# Patient Record
Sex: Male | Born: 2000
Health system: Southern US, Community
[De-identification: ages and names within clinical notes are randomized; demographics above are authoritative.]

---
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
## 2020-07-17 DIAGNOSIS — Z20822 Contact with and (suspected) exposure to covid-19: Secondary | ICD-10-CM | POA: Diagnosis not present

## 2020-10-22 DIAGNOSIS — R21 Rash and other nonspecific skin eruption: Secondary | ICD-10-CM | POA: Diagnosis not present

## 2020-10-22 DIAGNOSIS — T7840XA Allergy, unspecified, initial encounter: Secondary | ICD-10-CM | POA: Diagnosis not present

## 2021-04-16 DIAGNOSIS — M62452 Contracture of muscle, left thigh: Secondary | ICD-10-CM | POA: Diagnosis not present

## 2021-04-19 DIAGNOSIS — M62452 Contracture of muscle, left thigh: Secondary | ICD-10-CM | POA: Diagnosis not present

## 2021-04-20 DIAGNOSIS — M62452 Contracture of muscle, left thigh: Secondary | ICD-10-CM | POA: Diagnosis not present

## 2021-04-22 DIAGNOSIS — M62452 Contracture of muscle, left thigh: Secondary | ICD-10-CM | POA: Diagnosis not present

## 2021-04-23 DIAGNOSIS — M62452 Contracture of muscle, left thigh: Secondary | ICD-10-CM | POA: Diagnosis not present

## 2021-04-26 DIAGNOSIS — M62452 Contracture of muscle, left thigh: Secondary | ICD-10-CM | POA: Diagnosis not present

## 2021-04-29 DIAGNOSIS — M62452 Contracture of muscle, left thigh: Secondary | ICD-10-CM | POA: Diagnosis not present

## 2021-05-03 DIAGNOSIS — M62452 Contracture of muscle, left thigh: Secondary | ICD-10-CM | POA: Diagnosis not present

## 2021-05-11 DIAGNOSIS — M62452 Contracture of muscle, left thigh: Secondary | ICD-10-CM | POA: Diagnosis not present

## 2021-06-16 DIAGNOSIS — M25552 Pain in left hip: Secondary | ICD-10-CM | POA: Diagnosis not present

## 2021-06-28 DIAGNOSIS — G44309 Post-traumatic headache, unspecified, not intractable: Secondary | ICD-10-CM | POA: Diagnosis not present

## 2021-06-29 DIAGNOSIS — M62452 Contracture of muscle, left thigh: Secondary | ICD-10-CM | POA: Diagnosis not present

## 2021-06-30 DIAGNOSIS — M62452 Contracture of muscle, left thigh: Secondary | ICD-10-CM | POA: Diagnosis not present

## 2021-07-01 DIAGNOSIS — M62452 Contracture of muscle, left thigh: Secondary | ICD-10-CM | POA: Diagnosis not present

## 2021-07-02 DIAGNOSIS — M62452 Contracture of muscle, left thigh: Secondary | ICD-10-CM | POA: Diagnosis not present

## 2021-07-06 DIAGNOSIS — M62452 Contracture of muscle, left thigh: Secondary | ICD-10-CM | POA: Diagnosis not present

## 2021-07-08 DIAGNOSIS — M62452 Contracture of muscle, left thigh: Secondary | ICD-10-CM | POA: Diagnosis not present

## 2021-07-09 DIAGNOSIS — M62452 Contracture of muscle, left thigh: Secondary | ICD-10-CM | POA: Diagnosis not present

## 2021-07-17 IMAGING — DX DG TOE GREAT 2+V*R*
3 series · 3 of 3 positions shown · non-contrast
Comparison: Prior radiograph from earlier the same day.

CLINICAL DATA: Follow-up examination status post reduction.

EXAM:
RIGHT GREAT TOE

[toe ap]
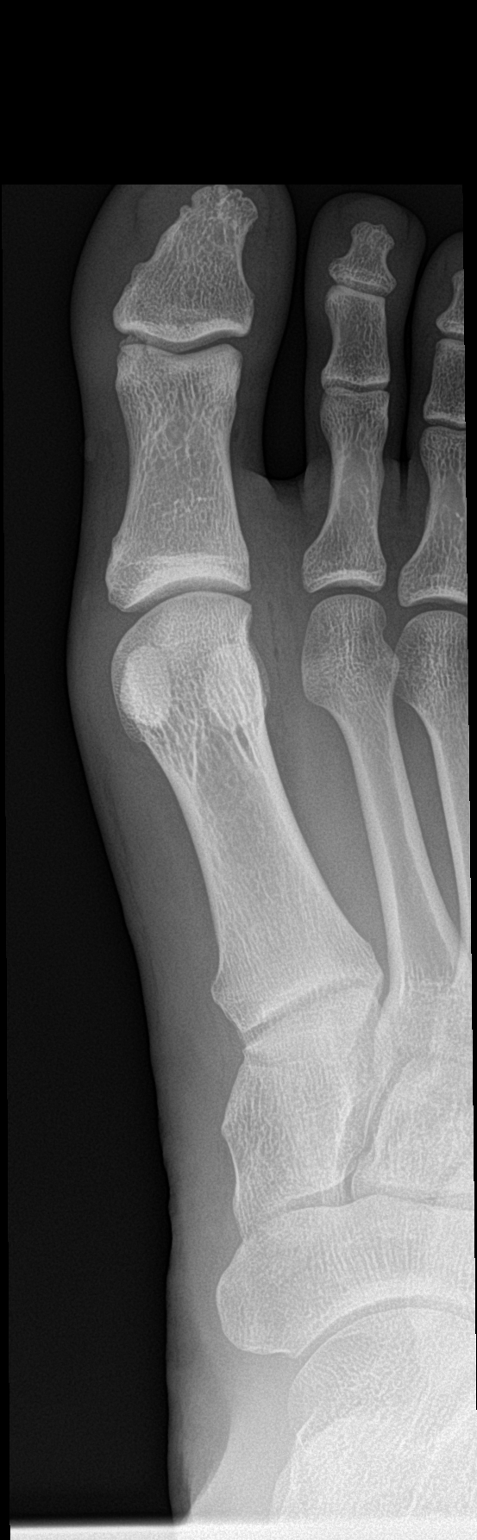

[toe obl]
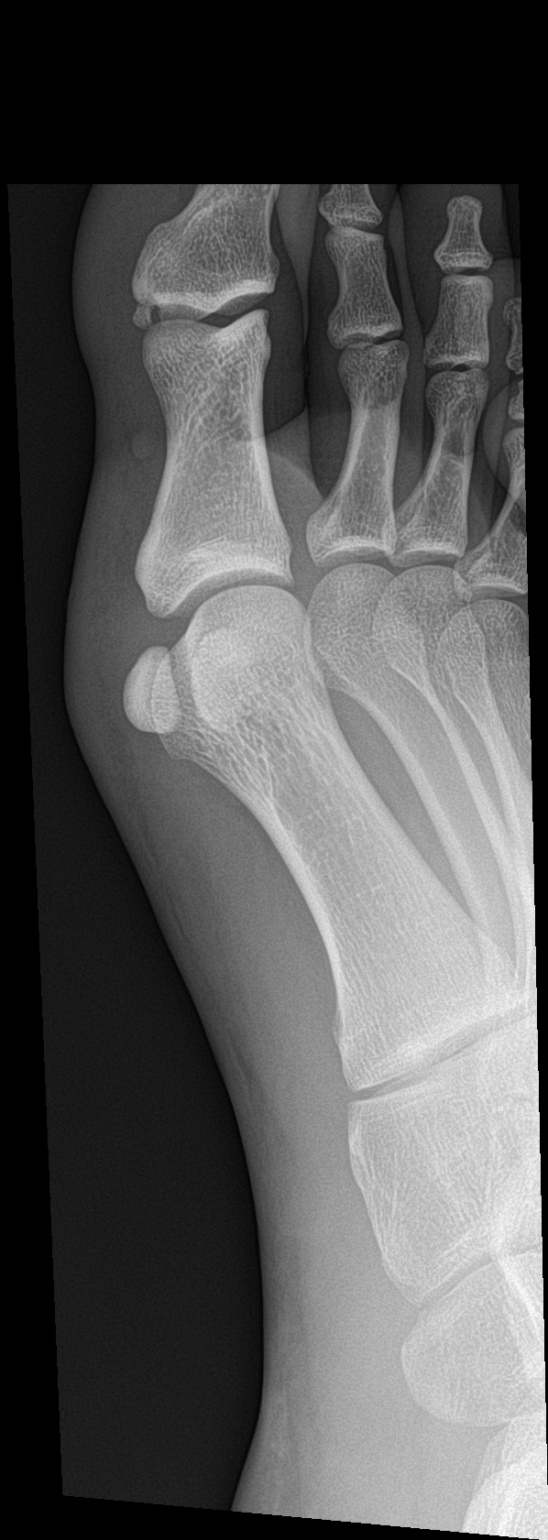

[toe lat]
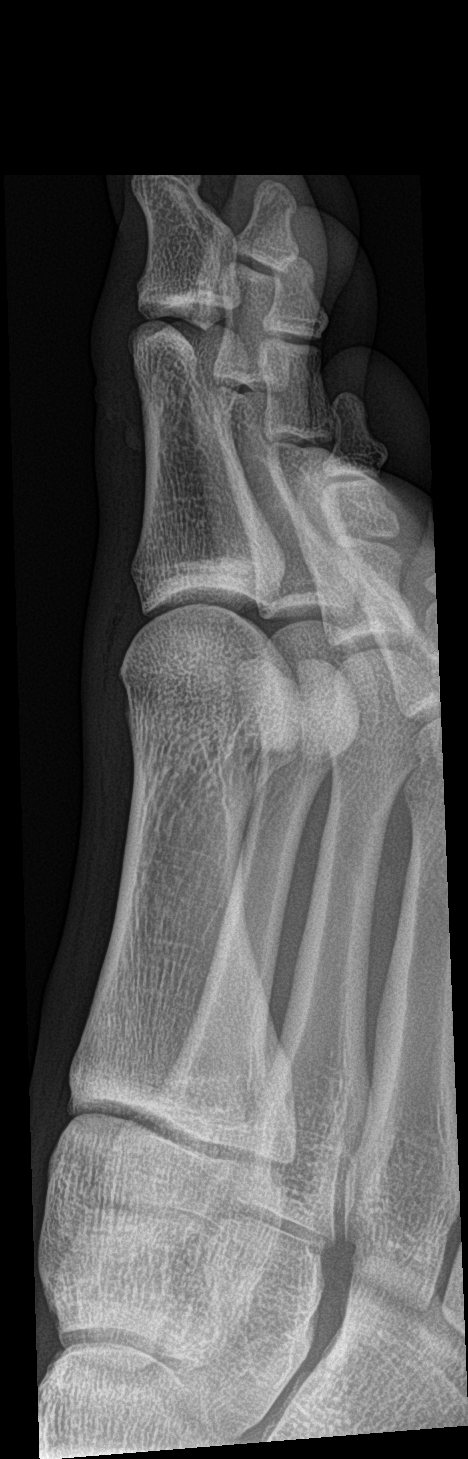

[3 of 3 positions shown; findings below may reference images not displayed]

FINDINGS: There has been interval reduction of the first IP joint, now in
normal anatomic alignment. Associated small fracture fragment again
noted, stable. No new fracture. Remainder the visualized osseous
structures demonstrate no new finding.
IMPRESSION: 1. Interval reduction of first IP joint, now in normal anatomic
alignment.
2. Associated small mildly displaced fracture fragment at the base
of the right first distal phalanx, stable.

## 2022-02-15 DIAGNOSIS — Z0000 Encounter for general adult medical examination without abnormal findings: Secondary | ICD-10-CM | POA: Diagnosis not present

## 2022-02-22 DIAGNOSIS — S63501A Unspecified sprain of right wrist, initial encounter: Secondary | ICD-10-CM | POA: Diagnosis not present

## 2022-02-23 DIAGNOSIS — S63501D Unspecified sprain of right wrist, subsequent encounter: Secondary | ICD-10-CM | POA: Diagnosis not present

## 2022-02-24 DIAGNOSIS — S63501D Unspecified sprain of right wrist, subsequent encounter: Secondary | ICD-10-CM | POA: Diagnosis not present

## 2022-02-25 DIAGNOSIS — S63501D Unspecified sprain of right wrist, subsequent encounter: Secondary | ICD-10-CM | POA: Diagnosis not present

## 2022-02-28 DIAGNOSIS — S63501D Unspecified sprain of right wrist, subsequent encounter: Secondary | ICD-10-CM | POA: Diagnosis not present

## 2022-03-01 DIAGNOSIS — S63501D Unspecified sprain of right wrist, subsequent encounter: Secondary | ICD-10-CM | POA: Diagnosis not present

## 2022-03-02 DIAGNOSIS — S63501D Unspecified sprain of right wrist, subsequent encounter: Secondary | ICD-10-CM | POA: Diagnosis not present

## 2022-03-03 DIAGNOSIS — S63501D Unspecified sprain of right wrist, subsequent encounter: Secondary | ICD-10-CM | POA: Diagnosis not present

## 2022-03-04 DIAGNOSIS — S63501D Unspecified sprain of right wrist, subsequent encounter: Secondary | ICD-10-CM | POA: Diagnosis not present

## 2022-03-07 DIAGNOSIS — S63501D Unspecified sprain of right wrist, subsequent encounter: Secondary | ICD-10-CM | POA: Diagnosis not present

## 2022-03-08 DIAGNOSIS — S63501D Unspecified sprain of right wrist, subsequent encounter: Secondary | ICD-10-CM | POA: Diagnosis not present

## 2022-03-09 DIAGNOSIS — S63501D Unspecified sprain of right wrist, subsequent encounter: Secondary | ICD-10-CM | POA: Diagnosis not present

## 2022-03-10 DIAGNOSIS — S63501D Unspecified sprain of right wrist, subsequent encounter: Secondary | ICD-10-CM | POA: Diagnosis not present

## 2022-03-11 DIAGNOSIS — S63501D Unspecified sprain of right wrist, subsequent encounter: Secondary | ICD-10-CM | POA: Diagnosis not present

## 2022-03-14 DIAGNOSIS — S63501D Unspecified sprain of right wrist, subsequent encounter: Secondary | ICD-10-CM | POA: Diagnosis not present

## 2022-03-15 DIAGNOSIS — S63501D Unspecified sprain of right wrist, subsequent encounter: Secondary | ICD-10-CM | POA: Diagnosis not present

## 2022-03-16 DIAGNOSIS — S63501D Unspecified sprain of right wrist, subsequent encounter: Secondary | ICD-10-CM | POA: Diagnosis not present

## 2022-03-17 DIAGNOSIS — S63501D Unspecified sprain of right wrist, subsequent encounter: Secondary | ICD-10-CM | POA: Diagnosis not present

## 2022-03-18 DIAGNOSIS — S63501D Unspecified sprain of right wrist, subsequent encounter: Secondary | ICD-10-CM | POA: Diagnosis not present

## 2022-03-21 DIAGNOSIS — S63501D Unspecified sprain of right wrist, subsequent encounter: Secondary | ICD-10-CM | POA: Diagnosis not present

## 2022-03-22 DIAGNOSIS — S86112A Strain of other muscle(s) and tendon(s) of posterior muscle group at lower leg level, left leg, initial encounter: Secondary | ICD-10-CM | POA: Diagnosis not present

## 2022-03-22 DIAGNOSIS — S63501D Unspecified sprain of right wrist, subsequent encounter: Secondary | ICD-10-CM | POA: Diagnosis not present

## 2022-03-24 DIAGNOSIS — S86112D Strain of other muscle(s) and tendon(s) of posterior muscle group at lower leg level, left leg, subsequent encounter: Secondary | ICD-10-CM | POA: Diagnosis not present

## 2022-03-24 DIAGNOSIS — S63501D Unspecified sprain of right wrist, subsequent encounter: Secondary | ICD-10-CM | POA: Diagnosis not present

## 2022-03-25 DIAGNOSIS — S63501D Unspecified sprain of right wrist, subsequent encounter: Secondary | ICD-10-CM | POA: Diagnosis not present

## 2022-03-25 DIAGNOSIS — S86112D Strain of other muscle(s) and tendon(s) of posterior muscle group at lower leg level, left leg, subsequent encounter: Secondary | ICD-10-CM | POA: Diagnosis not present

## 2022-03-28 DIAGNOSIS — S63501D Unspecified sprain of right wrist, subsequent encounter: Secondary | ICD-10-CM | POA: Diagnosis not present

## 2022-03-29 DIAGNOSIS — S63501D Unspecified sprain of right wrist, subsequent encounter: Secondary | ICD-10-CM | POA: Diagnosis not present

## 2022-03-30 DIAGNOSIS — S63501D Unspecified sprain of right wrist, subsequent encounter: Secondary | ICD-10-CM | POA: Diagnosis not present

## 2022-04-05 DIAGNOSIS — S63501D Unspecified sprain of right wrist, subsequent encounter: Secondary | ICD-10-CM | POA: Diagnosis not present

## 2022-04-06 DIAGNOSIS — S63501D Unspecified sprain of right wrist, subsequent encounter: Secondary | ICD-10-CM | POA: Diagnosis not present

## 2022-04-07 DIAGNOSIS — S63501D Unspecified sprain of right wrist, subsequent encounter: Secondary | ICD-10-CM | POA: Diagnosis not present

## 2022-04-08 DIAGNOSIS — S63501D Unspecified sprain of right wrist, subsequent encounter: Secondary | ICD-10-CM | POA: Diagnosis not present

## 2022-04-11 DIAGNOSIS — S63501D Unspecified sprain of right wrist, subsequent encounter: Secondary | ICD-10-CM | POA: Diagnosis not present

## 2022-04-12 DIAGNOSIS — J208 Acute bronchitis due to other specified organisms: Secondary | ICD-10-CM | POA: Diagnosis not present

## 2022-04-12 DIAGNOSIS — R051 Acute cough: Secondary | ICD-10-CM | POA: Diagnosis not present

## 2022-04-12 DIAGNOSIS — R070 Pain in throat: Secondary | ICD-10-CM | POA: Diagnosis not present

## 2022-04-12 DIAGNOSIS — Z20822 Contact with and (suspected) exposure to covid-19: Secondary | ICD-10-CM | POA: Diagnosis not present

## 2022-04-13 DIAGNOSIS — S63501D Unspecified sprain of right wrist, subsequent encounter: Secondary | ICD-10-CM | POA: Diagnosis not present

## 2022-04-14 DIAGNOSIS — S63501D Unspecified sprain of right wrist, subsequent encounter: Secondary | ICD-10-CM | POA: Diagnosis not present

## 2022-04-18 DIAGNOSIS — S63501D Unspecified sprain of right wrist, subsequent encounter: Secondary | ICD-10-CM | POA: Diagnosis not present

## 2022-04-19 DIAGNOSIS — S63501D Unspecified sprain of right wrist, subsequent encounter: Secondary | ICD-10-CM | POA: Diagnosis not present

## 2022-04-20 DIAGNOSIS — S63501D Unspecified sprain of right wrist, subsequent encounter: Secondary | ICD-10-CM | POA: Diagnosis not present

## 2022-04-21 DIAGNOSIS — S63501D Unspecified sprain of right wrist, subsequent encounter: Secondary | ICD-10-CM | POA: Diagnosis not present

## 2022-04-22 DIAGNOSIS — S63501D Unspecified sprain of right wrist, subsequent encounter: Secondary | ICD-10-CM | POA: Diagnosis not present

## 2022-04-25 DIAGNOSIS — S63501D Unspecified sprain of right wrist, subsequent encounter: Secondary | ICD-10-CM | POA: Diagnosis not present

## 2022-04-27 DIAGNOSIS — S63501D Unspecified sprain of right wrist, subsequent encounter: Secondary | ICD-10-CM | POA: Diagnosis not present

## 2022-04-28 DIAGNOSIS — S63501D Unspecified sprain of right wrist, subsequent encounter: Secondary | ICD-10-CM | POA: Diagnosis not present

## 2022-04-29 DIAGNOSIS — S63501D Unspecified sprain of right wrist, subsequent encounter: Secondary | ICD-10-CM | POA: Diagnosis not present

## 2022-05-09 DIAGNOSIS — S63501D Unspecified sprain of right wrist, subsequent encounter: Secondary | ICD-10-CM | POA: Diagnosis not present

## 2022-05-18 DIAGNOSIS — S63501D Unspecified sprain of right wrist, subsequent encounter: Secondary | ICD-10-CM | POA: Diagnosis not present
